# Patient Record
Sex: Female | Born: 1960 | Race: White | Hispanic: No | Smoking: Current every day smoker
Health system: Southern US, Community
[De-identification: ages and names within clinical notes are randomized; demographics above are authoritative.]

## PROBLEM LIST (undated history)

## (undated) DIAGNOSIS — G8929 Other chronic pain: Secondary | ICD-10-CM

## (undated) DIAGNOSIS — J449 Chronic obstructive pulmonary disease, unspecified: Secondary | ICD-10-CM

## (undated) DIAGNOSIS — M069 Rheumatoid arthritis, unspecified: Secondary | ICD-10-CM

## (undated) DIAGNOSIS — F1910 Other psychoactive substance abuse, uncomplicated: Secondary | ICD-10-CM

## (undated) DIAGNOSIS — B1920 Unspecified viral hepatitis C without hepatic coma: Secondary | ICD-10-CM

## (undated) DIAGNOSIS — K219 Gastro-esophageal reflux disease without esophagitis: Secondary | ICD-10-CM

## (undated) DIAGNOSIS — M1990 Unspecified osteoarthritis, unspecified site: Secondary | ICD-10-CM

---

## 2016-01-16 ENCOUNTER — Other Ambulatory Visit (HOSPITAL_COMMUNITY): Payer: Self-pay

## 2016-01-16 ENCOUNTER — Inpatient Hospital Stay
Admission: AD | Admit: 2016-01-16 | Discharge: 2016-02-24 | Disposition: A | Discharge status: Skilled Nursing Facility | Payer: Self-pay | Source: Ambulatory Visit | Attending: Internal Medicine | Admitting: Internal Medicine

## 2016-01-16 DIAGNOSIS — R1310 Dysphagia, unspecified: Secondary | ICD-10-CM

## 2016-01-16 DIAGNOSIS — K5900 Constipation, unspecified: Secondary | ICD-10-CM

## 2016-01-16 DIAGNOSIS — Z931 Gastrostomy status: Secondary | ICD-10-CM

## 2016-01-16 DIAGNOSIS — J80 Acute respiratory distress syndrome: Secondary | ICD-10-CM

## 2016-01-16 DIAGNOSIS — E46 Unspecified protein-calorie malnutrition: Secondary | ICD-10-CM | POA: Insufficient documentation

## 2016-01-16 DIAGNOSIS — J189 Pneumonia, unspecified organism: Secondary | ICD-10-CM

## 2016-01-16 DIAGNOSIS — J9601 Acute respiratory failure with hypoxia: Secondary | ICD-10-CM | POA: Diagnosis present

## 2016-01-16 DIAGNOSIS — R109 Unspecified abdominal pain: Secondary | ICD-10-CM

## 2016-01-16 DIAGNOSIS — J9690 Respiratory failure, unspecified, unspecified whether with hypoxia or hypercapnia: Secondary | ICD-10-CM

## 2016-01-16 DIAGNOSIS — Z4659 Encounter for fitting and adjustment of other gastrointestinal appliance and device: Secondary | ICD-10-CM

## 2016-01-16 DIAGNOSIS — Z930 Tracheostomy status: Secondary | ICD-10-CM

## 2016-01-16 DIAGNOSIS — Z9911 Dependence on respirator [ventilator] status: Secondary | ICD-10-CM

## 2016-01-16 HISTORY — DX: Unspecified osteoarthritis, unspecified site: M19.90

## 2016-01-16 HISTORY — DX: Gastro-esophageal reflux disease without esophagitis: K21.9

## 2016-01-16 HISTORY — DX: Chronic obstructive pulmonary disease, unspecified: J44.9

## 2016-01-16 HISTORY — DX: Unspecified viral hepatitis C without hepatic coma: B19.20

## 2016-01-16 HISTORY — DX: Rheumatoid arthritis, unspecified: M06.9

## 2016-01-16 HISTORY — DX: Other psychoactive substance abuse, uncomplicated: F19.10

## 2016-01-16 HISTORY — DX: Other chronic pain: G89.29

## 2016-01-17 LAB — CBC WITH DIFFERENTIAL/PLATELET
BASOS PCT: 0 %
Basophils Absolute: 0 10*3/uL (ref 0.0–0.1)
EOS ABS: 0.1 10*3/uL (ref 0.0–0.7)
EOS PCT: 1 %
HCT: 25.6 % — ABNORMAL LOW (ref 36.0–46.0)
Hemoglobin: 7.4 g/dL — ABNORMAL LOW (ref 12.0–15.0)
LYMPHS ABS: 2.7 10*3/uL (ref 0.7–4.0)
Lymphocytes Relative: 20 %
MCH: 27.1 pg (ref 26.0–34.0)
MCHC: 28.9 g/dL — ABNORMAL LOW (ref 30.0–36.0)
MCV: 93.8 fL (ref 78.0–100.0)
Monocytes Absolute: 0.6 10*3/uL (ref 0.1–1.0)
Monocytes Relative: 5 %
Neutro Abs: 10 10*3/uL — ABNORMAL HIGH (ref 1.7–7.7)
Neutrophils Relative %: 74 %
PLATELETS: 485 10*3/uL — AB (ref 150–400)
RBC: 2.73 MIL/uL — AB (ref 3.87–5.11)
RDW: 19.3 % — ABNORMAL HIGH (ref 11.5–15.5)
WBC: 13.5 10*3/uL — AB (ref 4.0–10.5)

## 2016-01-17 LAB — URINE MICROSCOPIC-ADD ON

## 2016-01-17 LAB — MAGNESIUM: Magnesium: 1.7 mg/dL (ref 1.7–2.4)

## 2016-01-17 LAB — COMPREHENSIVE METABOLIC PANEL
ALBUMIN: 1.8 g/dL — AB (ref 3.5–5.0)
ALT: 64 U/L — AB (ref 14–54)
ANION GAP: 13 (ref 5–15)
AST: 242 U/L — ABNORMAL HIGH (ref 15–41)
Alkaline Phosphatase: 43 U/L (ref 38–126)
BUN: 41 mg/dL — ABNORMAL HIGH (ref 6–20)
CHLORIDE: 106 mmol/L (ref 101–111)
CO2: 32 mmol/L (ref 22–32)
CREATININE: 0.92 mg/dL (ref 0.44–1.00)
Calcium: 8.5 mg/dL — ABNORMAL LOW (ref 8.9–10.3)
GFR calc non Af Amer: >60 mL/min (ref >60)
Glucose, Bld: 146 mg/dL — ABNORMAL HIGH (ref 65–99)
Potassium: 2.9 mmol/L — ABNORMAL LOW (ref 3.5–5.1)
SODIUM: 151 mmol/L — AB (ref 135–145)
Total Bilirubin: 0.4 mg/dL (ref 0.3–1.2)
Total Protein: 5.8 g/dL — ABNORMAL LOW (ref 6.5–8.1)

## 2016-01-17 LAB — URINALYSIS, ROUTINE W REFLEX MICROSCOPIC
BILIRUBIN URINE: NEGATIVE
Glucose, UA: NEGATIVE mg/dL
Ketones, ur: NEGATIVE mg/dL
Nitrite: NEGATIVE
PROTEIN: 100 mg/dL — AB
Specific Gravity, Urine: 1.016 (ref 1.005–1.030)
pH: 7 (ref 5.0–8.0)

## 2016-01-17 LAB — BLOOD GAS, ARTERIAL
Acid-Base Excess: 8.7 mmol/L — ABNORMAL HIGH (ref 0.0–2.0)
BICARBONATE: 32.1 meq/L — AB (ref 20.0–24.0)
Drawn by: 27022
FIO2: 0.3
O2 SAT: 89 %
PATIENT TEMPERATURE: 101.3
PCO2 ART: 43 mmHg (ref 35.0–45.0)
PEEP: 5 cmH2O
PH ART: 7.493 — AB (ref 7.350–7.450)
PO2 ART: 63.6 mmHg — AB (ref 80.0–100.0)
RATE: 22 resp/min
TCO2: 33.4 mmol/L (ref 0–100)
VT: 550 mL

## 2016-01-17 LAB — C DIFFICILE QUICK SCREEN W PCR REFLEX
C Diff antigen: NEGATIVE
C Diff interpretation: NEGATIVE
C Diff toxin: NEGATIVE

## 2016-01-17 LAB — PHOSPHORUS: PHOSPHORUS: 3 mg/dL (ref 2.5–4.6)

## 2016-01-17 LAB — PROCALCITONIN: PROCALCITONIN: 0.28 ng/mL

## 2016-01-17 LAB — T4, FREE: Free T4: 0.54 ng/dL — ABNORMAL LOW (ref 0.61–1.12)

## 2016-01-17 LAB — TSH: TSH: 1.881 u[IU]/mL (ref 0.350–4.500)

## 2016-01-18 LAB — BASIC METABOLIC PANEL
ANION GAP: 9 (ref 5–15)
BUN: 26 mg/dL — ABNORMAL HIGH (ref 6–20)
CALCIUM: 8.6 mg/dL — AB (ref 8.9–10.3)
CHLORIDE: 112 mmol/L — AB (ref 101–111)
CO2: 30 mmol/L (ref 22–32)
Creatinine, Ser: 0.73 mg/dL (ref 0.44–1.00)
GFR calc non Af Amer: >60 mL/min (ref >60)
GLUCOSE: 140 mg/dL — AB (ref 65–99)
Potassium: 3 mmol/L — ABNORMAL LOW (ref 3.5–5.1)
Sodium: 151 mmol/L — ABNORMAL HIGH (ref 135–145)

## 2016-01-18 LAB — CBC
HEMATOCRIT: 26.2 % — AB (ref 36.0–46.0)
HEMOGLOBIN: 7.5 g/dL — AB (ref 12.0–15.0)
MCH: 26.9 pg (ref 26.0–34.0)
MCHC: 28.6 g/dL — AB (ref 30.0–36.0)
MCV: 93.9 fL (ref 78.0–100.0)
Platelets: 493 10*3/uL — ABNORMAL HIGH (ref 150–400)
RBC: 2.79 MIL/uL — ABNORMAL LOW (ref 3.87–5.11)
RDW: 19.3 % — AB (ref 11.5–15.5)
WBC: 13.5 10*3/uL — AB (ref 4.0–10.5)

## 2016-01-18 LAB — URINE CULTURE

## 2016-01-18 LAB — PROCALCITONIN: Procalcitonin: 0.14 ng/mL

## 2016-01-19 ENCOUNTER — Encounter: Payer: Self-pay | Admitting: Adult Health

## 2016-01-19 DIAGNOSIS — J80 Acute respiratory distress syndrome: Secondary | ICD-10-CM | POA: Insufficient documentation

## 2016-01-19 DIAGNOSIS — J9601 Acute respiratory failure with hypoxia: Secondary | ICD-10-CM | POA: Diagnosis present

## 2016-01-19 LAB — CBC
HCT: 25.4 % — ABNORMAL LOW (ref 36.0–46.0)
Hemoglobin: 7.7 g/dL — ABNORMAL LOW (ref 12.0–15.0)
MCH: 28.5 pg (ref 26.0–34.0)
MCHC: 30.3 g/dL (ref 30.0–36.0)
MCV: 94.1 fL (ref 78.0–100.0)
Platelets: 448 10*3/uL — ABNORMAL HIGH (ref 150–400)
RBC: 2.7 MIL/uL — ABNORMAL LOW (ref 3.87–5.11)
RDW: 18.8 % — ABNORMAL HIGH (ref 11.5–15.5)
WBC: 14.4 10*3/uL — AB (ref 4.0–10.5)

## 2016-01-19 LAB — BASIC METABOLIC PANEL
ANION GAP: 11 (ref 5–15)
BUN: 25 mg/dL — ABNORMAL HIGH (ref 6–20)
CALCIUM: 8.5 mg/dL — AB (ref 8.9–10.3)
CHLORIDE: 105 mmol/L (ref 101–111)
CO2: 29 mmol/L (ref 22–32)
CREATININE: 0.65 mg/dL (ref 0.44–1.00)
GFR calc non Af Amer: >60 mL/min (ref >60)
Glucose, Bld: 104 mg/dL — ABNORMAL HIGH (ref 65–99)
Potassium: 2.4 mmol/L — CL (ref 3.5–5.1)
SODIUM: 145 mmol/L (ref 135–145)

## 2016-01-19 LAB — PHOSPHORUS: PHOSPHORUS: 3.1 mg/dL (ref 2.5–4.6)

## 2016-01-19 LAB — MAGNESIUM: MAGNESIUM: 1.4 mg/dL — AB (ref 1.7–2.4)

## 2016-01-19 NOTE — Consult Note (Signed)
Name: Robyn Gross MRN: [id number] DOB: Sep 15, 1961    ADMISSION DATE:  01/16/2016 CONSULTATION DATE:  2/20  REFERRING MD :  Hijazi  CHIEF COMPLAINT:  Vent management, acute respiratory failure   BRIEF PATIENT DESCRIPTION: 55yo female with hx COPD, RA on methotrexate, chronic pain, polysubstance abuse, Hep C initially admitted 2/5 to outside hospital with acute respiratory failure r/t CAP, AECOPD.  She required intubation 2/6 and mechanical ventilatory support.   Course was c/b AKI, sepsis, ARDS.  She was treated with IV abx and ultimately tx to [facility] 2/17 for further vent wean and ongoing rehabilitation.  PCCM consulted 2/20 for vent management and assist with weaning efforts.  She remains on full vent support.    SIGNIFICANT EVENTS    STUDIES:     HISTORY OF PRESENT ILLNESS:  55yo female with hx COPD, RA on methotrexate, chronic pain, polysubstance abuse, Hep C initially admitted 2/5 to outside hospital with acute respiratory failure r/t CAP, AECOPD, ARDS.  She required intubation and mechanical ventilatory support (unclear date of intubation).  She was treated with IV abx and ultimately tx to [facility] 2/17 for further vent wean and ongoing rehabilitation.  PCCM consulted 2/20 for vent management and assist with weaning efforts.  She remains on full vent support.   PAST MEDICAL HISTORY :   has a past medical history of COPD (chronic obstructive pulmonary disease) (HCC); Rheumatoid arthritis (HCC); Hepatitis C; Polysubstance abuse; Chronic pain; GERD (gastroesophageal reflux disease); and Osteoarthritis.  has no past surgical history on file. Prior to Admission medications   Not on File   Allergies  Allergen Reactions  . Sulfa Antibiotics     FAMILY HISTORY:  Family hx unavailable, pt somnolent on vent, none found in review of records.   SOCIAL HISTORY:  reports that she has been smoking.  She does not have any smokeless tobacco history on file.  REVIEW OF SYSTEMS:     As per HPI - All other systems reviewed and were neg.    SUBJECTIVE:   VITAL SIGNS:   HR 93 O2 sats 100% on 0.35 116/68 RR 22  PHYSICAL EXAMINATION: General:  Chronically ill appearing female, NAD on vent  Neuro:  Somnolent, not on sedation, opens eyes to voice, does not follow commands HEENT:  Mm moist, ETT Cardiovascular:  s1s2 rrr Lungs:  resps even, non labored on full support, few scattered rhonchi, mild intermittent exp wheeze  Abdomen:  Round, soft, +bs  Musculoskeletal:  Warm and dry, no sig edema, foot drop    Recent Labs Lab 01/17/16 0625 01/18/16 0420 01/19/16 0630  NA 151* 151* 145  K 2.9* 3.0* 2.4*  CL 106 112* 105  CO2 32 30 29  BUN 41* 26* 25*  CREATININE 0.92 0.73 0.65  GLUCOSE 146* 140* 104*    Recent Labs Lab 01/17/16 0625 01/18/16 0420 01/19/16 0630  HGB 7.4* 7.5* 7.7*  HCT 25.6* 26.2* 25.4*  WBC 13.5* 13.5* 14.4*  PLT 485* 493* 448*   No results found.  ASSESSMENT / PLAN:  Acute respiratory failure -- multifactorial r/t severe CAP with ARDS +/- AECOPD.  Continues to fail SBT.  Failed over weekend r/t apnea.  This am (2/20) failed r/t tachypnea with RR 40's.  Initially intubated 2/6.   AECOPD  Severe CAP - abx complete  Anemia  AKI - resolved   PLAN -  -F/u CXR  -Wean steroids as able -Continue BD's  -Cont vent support with daily SBT  -Limit sedating meds  as able -- not on continuous sedation but does have PRN versed - use sparingly and consider d/c qhs trazadone -If no significant progress with weaning over next 24 hours will likely need trach for prolonged wean.  Currently D14 ETT -Anemia w/u per primary    Dirk Dress, NP 01/19/2016  11:38 AM Pager: [phone] or [phone]   Attending Note:  I have examined patient, reviewed labs, studies and notes. I have discussed the case with Jasper Riling, and I agree with the data and plans as amended above. She has been ventilated for ~ 2 weeks, do not see evidence that  she is going to wean today. I would recommend trach by ENT, continued attempts at slow vent weaning. Independent critical care time is 31 minutes.   Levy Pupa, MD, PhD 01/19/2016, 12:15 PM [facility] [phone] or if no answer [phone]

## 2016-01-20 ENCOUNTER — Other Ambulatory Visit (HOSPITAL_COMMUNITY): Payer: Self-pay

## 2016-01-20 LAB — CBC
HEMATOCRIT: 27.3 % — AB (ref 36.0–46.0)
Hemoglobin: 8.3 g/dL — ABNORMAL LOW (ref 12.0–15.0)
MCH: 28.2 pg (ref 26.0–34.0)
MCHC: 30.4 g/dL (ref 30.0–36.0)
MCV: 92.9 fL (ref 78.0–100.0)
Platelets: 508 10*3/uL — ABNORMAL HIGH (ref 150–400)
RBC: 2.94 MIL/uL — ABNORMAL LOW (ref 3.87–5.11)
RDW: 18.6 % — AB (ref 11.5–15.5)
WBC: 17.3 10*3/uL — AB (ref 4.0–10.5)

## 2016-01-20 LAB — BASIC METABOLIC PANEL
ANION GAP: 12 (ref 5–15)
BUN: 25 mg/dL — ABNORMAL HIGH (ref 6–20)
CALCIUM: 8.4 mg/dL — AB (ref 8.9–10.3)
CO2: 27 mmol/L (ref 22–32)
Chloride: 106 mmol/L (ref 101–111)
Creatinine, Ser: 0.67 mg/dL (ref 0.44–1.00)
Glucose, Bld: 123 mg/dL — ABNORMAL HIGH (ref 65–99)
Potassium: 2.8 mmol/L — ABNORMAL LOW (ref 3.5–5.1)
SODIUM: 145 mmol/L (ref 135–145)

## 2016-01-20 LAB — MAGNESIUM: MAGNESIUM: 1.4 mg/dL — AB (ref 1.7–2.4)

## 2016-01-20 LAB — PROCALCITONIN

## 2016-01-21 ENCOUNTER — Other Ambulatory Visit (HOSPITAL_COMMUNITY): Payer: Self-pay

## 2016-01-21 LAB — BASIC METABOLIC PANEL
ANION GAP: 12 (ref 5–15)
BUN: 25 mg/dL — ABNORMAL HIGH (ref 6–20)
CALCIUM: 8.5 mg/dL — AB (ref 8.9–10.3)
CO2: 27 mmol/L (ref 22–32)
CREATININE: 0.56 mg/dL (ref 0.44–1.00)
Chloride: 107 mmol/L (ref 101–111)
GFR calc non Af Amer: >60 mL/min (ref >60)
Glucose, Bld: 119 mg/dL — ABNORMAL HIGH (ref 65–99)
Potassium: 2.6 mmol/L — CL (ref 3.5–5.1)
SODIUM: 146 mmol/L — AB (ref 135–145)

## 2016-01-21 LAB — CBC
HEMATOCRIT: 25.7 % — AB (ref 36.0–46.0)
HEMOGLOBIN: 7.7 g/dL — AB (ref 12.0–15.0)
MCH: 27.3 pg (ref 26.0–34.0)
MCHC: 30 g/dL (ref 30.0–36.0)
MCV: 91.1 fL (ref 78.0–100.0)
Platelets: 468 10*3/uL — ABNORMAL HIGH (ref 150–400)
RBC: 2.82 MIL/uL — ABNORMAL LOW (ref 3.87–5.11)
RDW: 19.2 % — AB (ref 11.5–15.5)
WBC: 15.3 10*3/uL — AB (ref 4.0–10.5)

## 2016-01-21 LAB — APTT: APTT: 32 s (ref 24–37)

## 2016-01-21 LAB — PROTIME-INR
INR: 1.13 (ref 0.00–1.49)
PROTHROMBIN TIME: 14.7 s (ref 11.6–15.2)

## 2016-01-21 LAB — MAGNESIUM: MAGNESIUM: 1.7 mg/dL (ref 1.7–2.4)

## 2016-01-21 LAB — POTASSIUM: POTASSIUM: 3.6 mmol/L (ref 3.5–5.1)

## 2016-01-21 LAB — PHOSPHORUS: PHOSPHORUS: 3.2 mg/dL (ref 2.5–4.6)

## 2016-01-21 MED ORDER — IOHEXOL 300 MG/ML  SOLN
50.0000 mL | Freq: Once | INTRAMUSCULAR | Status: AC | PRN
  Administered 2016-01-22: 30 mL

## 2016-01-22 ENCOUNTER — Other Ambulatory Visit (HOSPITAL_COMMUNITY): Payer: Self-pay

## 2016-01-22 DIAGNOSIS — Z4659 Encounter for fitting and adjustment of other gastrointestinal appliance and device: Secondary | ICD-10-CM | POA: Insufficient documentation

## 2016-01-22 DIAGNOSIS — R1310 Dysphagia, unspecified: Secondary | ICD-10-CM

## 2016-01-22 LAB — POTASSIUM: POTASSIUM: 3.2 mmol/L — AB (ref 3.5–5.1)

## 2016-01-22 NOTE — Consult Note (Signed)
Name: Robyn Gross MRN: [id number] DOB: 07/29/1961    ADMISSION DATE:  01/16/2016 CONSULTATION DATE:  2/20  REFERRING MD :  Hijazi  CHIEF COMPLAINT:  Vent management, acute respiratory failure   BRIEF PATIENT DESCRIPTION: 55yo female with hx COPD, RA on methotrexate, chronic pain, polysubstance abuse, Hep C initially admitted 2/5 to outside hospital with acute respiratory failure r/t CAP, AECOPD.  She required intubation 2/6 and mechanical ventilatory support.   Course was c/b AKI, sepsis, ARDS.  She was treated with IV abx and ultimately tx to [facility] 2/17 for further vent wean and ongoing rehabilitation.  PCCM consulted 2/20 for vent management and assist with weaning efforts.  She remains on full vent support.    SIGNIFICANT EVENTS   STUDIES:  CT 2/23>>>  SUBJECTIVE: just back from CT abdomen, NGT placed  VITAL SIGNS:   100.7, 123 26, 157/96  97% Vent 22 550 28% peep 5  PHYSICAL EXAMINATION: General:  Chronically ill appearing female, NAD on vent  Neuro: awakens, not follows commands, rass -1 HEENT: ETT Cardiovascular:  s1s2 rrr Lungs:  reduced resolved wheezing Abdomen:  Round, soft, +bs , mild distention Musculoskeletal:  Warm and dry, no sig edema, foot drop    Recent Labs Lab 01/19/16 0630 01/20/16 0608 01/21/16 0647 01/21/16 1910 01/22/16 0625  NA 145 145 146*  --   --   K 2.4* 2.8* 2.6* 3.6 3.2*  CL 105 106 107  --   --   CO2 29 27 27   --   --   BUN 25* 25* 25*  --   --   CREATININE 0.65 0.67 0.56  --   --   GLUCOSE 104* 123* 119*  --   --     Recent Labs Lab 01/19/16 0630 01/20/16 0608 01/21/16 0647  HGB 7.7* 8.3* 7.7*  HCT 25.4* 27.3* 25.7*  WBC 14.4* 17.3* 15.3*  PLT 448* 508* 468*   Dg Abd 1 View  01/22/2016  CLINICAL DATA:  Feeding tube placement EXAM: ABDOMEN - 1 VIEW COMPARISON:  Abdomen film of 01/16/2016. Fluoroscopy time of 2 minutes 36 seconds FINDINGS: A single C-arm spot film documents placement of the feeding tube with the tip  near the ligament of Treitz. A small amount of contrast water-soluble was injected to document position. IMPRESSION: Feeding tube placed with the tip near the ligament of Treitz. Electronically Signed   By: Ivar Drape M.D.   On: 01/22/2016 11:35   Dg Loyce Dys Tube Plc W/fl-no Rad  01/22/2016  CLINICAL DATA:  NASO G TUBE PLACEMENT WITH FLUORO Fluoroscopy was utilized by the requesting physician.  No radiographic interpretation.    ASSESSMENT / PLAN:  Acute respiratory failure -- multifactorial r/t severe CAP with ARDS +/- AECOPD.  Continues to fail SBT.  Failed over weekend r/t apnea.  This am (2/20) failed r/t tachypnea with RR 40's.  Initially intubated 2/6.   AECOPD  Severe CAP - abx complete  Anemia  AKI - resolved   PLAN -  -weaning efforts PS 12/5 , fails, this is over 2 weeks ETT = trach required pcxr not too impressive, some int changes rt, CT bases today with int changes -replace K  -WUA mandatory, fent prn -abg reviewed, reduce rate, avoid alk -coags reviewed -pred reduce daily -given the long period of time with ett , failed weaning, she would benefit from trach now and not Monday, concern vocal cord injury etc if wait to monday  -in best interest of pt,  will plan bedside trach asap -consider further weaning attempts to PS 20 -consider tolerate rr 40 if maintaining volumes as may be neuro related -will discuss with [facility] to have bedside evaluation of pt after trach for safety Ccm time 30 min   Lavon Paganini. Titus Mould, MD, [place] Pgr: [place] [facility]

## 2016-01-23 ENCOUNTER — Other Ambulatory Visit (HOSPITAL_COMMUNITY): Payer: Self-pay

## 2016-01-23 ENCOUNTER — Encounter (HOSPITAL_COMMUNITY): Payer: Self-pay

## 2016-01-23 DIAGNOSIS — J9690 Respiratory failure, unspecified, unspecified whether with hypoxia or hypercapnia: Secondary | ICD-10-CM | POA: Insufficient documentation

## 2016-01-23 DIAGNOSIS — Z9911 Dependence on respirator [ventilator] status: Secondary | ICD-10-CM | POA: Insufficient documentation

## 2016-01-23 LAB — BASIC METABOLIC PANEL
ANION GAP: 10 (ref 5–15)
BUN: 16 mg/dL (ref 6–20)
CALCIUM: 8.4 mg/dL — AB (ref 8.9–10.3)
CO2: 23 mmol/L (ref 22–32)
Chloride: 108 mmol/L (ref 101–111)
Creatinine, Ser: 0.65 mg/dL (ref 0.44–1.00)
Glucose, Bld: 115 mg/dL — ABNORMAL HIGH (ref 65–99)
POTASSIUM: 3.1 mmol/L — AB (ref 3.5–5.1)
SODIUM: 141 mmol/L (ref 135–145)

## 2016-01-23 LAB — URINALYSIS, ROUTINE W REFLEX MICROSCOPIC
Bilirubin Urine: NEGATIVE
GLUCOSE, UA: NEGATIVE mg/dL
KETONES UR: NEGATIVE mg/dL
Nitrite: NEGATIVE
PROTEIN: 30 mg/dL — AB
Specific Gravity, Urine: 1.019 (ref 1.005–1.030)
pH: 5.5 (ref 5.0–8.0)

## 2016-01-23 LAB — CBC
HCT: 26.7 % — ABNORMAL LOW (ref 36.0–46.0)
Hemoglobin: 8.2 g/dL — ABNORMAL LOW (ref 12.0–15.0)
MCH: 28.9 pg (ref 26.0–34.0)
MCHC: 30.7 g/dL (ref 30.0–36.0)
MCV: 94 fL (ref 78.0–100.0)
PLATELETS: 367 10*3/uL (ref 150–400)
RBC: 2.84 MIL/uL — AB (ref 3.87–5.11)
RDW: 19.9 % — AB (ref 11.5–15.5)
WBC: 16.4 10*3/uL — AB (ref 4.0–10.5)

## 2016-01-23 LAB — URINE MICROSCOPIC-ADD ON

## 2016-01-23 NOTE — Procedures (Signed)
Name:  Robyn Gross MRN:  [id number] DOB:  03-06-1961  OPERATIVE NOTE  Procedure:  Percutaneous tracheostomy.  Indications:  Ventilator-dependent respiratory failure.  Consent:  Procedure, alternatives, risks and benefits discussed with medical POA.  Questions answered.  Consent obtained.  Anesthesia:  Versed, fent, vec, etomidate x 2  Procedure summary:  Appropriate equipment was assembled.  The patient was identified as Robyn Gross and safety timeout was performed. The patient was placed in supine position with a towel roll behind shoulder blades and neck extended.  Sterile technique was used. The patient's neck and upper chest were prepped using chlorhexidine / alcohol scrub and the field was draped in usual sterile fashion with full body drape. After the adequate sedation / anesthesia was achieved, attention was directed at the midline trachea, where the cricothyroid membrane was palpated. Approximately two fingerbreadths above the sternal notch, a verticle incision was created with a scalpel after local infiltration with 0.2% Lidocaine. Then, using Seldinger technique and a percutaneous tracheostomy set, the trachea was entered with a 14 gauge needle with an overlying sheath. This was all confirmed under direct visualization of a fiberoptic flexible bronchoscope. Entrance into the trachea was identified through the third tracheal ring interspace. Following this, a guidewire was inserted. The needle was removed, leaving the sheath and the guidewire intact. Next, the sheath was removed and a small dilator was inserted. The tracheal rings were then dilated. A #6 Shiley was then opened. The balloon was checked. It was placed over a tracheal dilator, which was then advanced over the guidewire and through the previously dilated tract. The Shiley tracheostomy tube was noted to pass in the trachea with little resistance. The guidewire and dilator tubes were removed from the trachea. An inner cannula  was placed through the tracheostomy tube. The tracheostomy was then secured at the anterior neck with 4 monofilament sutures. The oral endotracheal tube was removed and the ventilator was attached to the newly placed tracheostomy tube. Adequate tidal volumes were noted. The cuff was inflated and no evidence of air leak was noted. No evidence of bleeding was noted. At this point, the procedure was concluded. Post-procedure chest x-ray was ordered.  Complications:  No immediate complications were noted.  Hemodynamic parameters and oxygenation remained stable throughout the procedure.  Estimated blood loss:  Less then 1 mL.  Nelda Bucks., MD Pulmonary and Critical Care Medicine [facility] Pager: [phone]  01/23/2016, 9:10 AM   Can follow up in [facility]  [phone]

## 2016-01-23 NOTE — Procedures (Signed)
Procedure done by Kreg Shropshire ACNP-BC  At first bronch was introduce through ET tube and structures of tracheal rings, carina identified for operator of tracheostomy who was Dr Tyson Alias. Light of bronch passed through trachea and skin for indentification of tracheal rings for tracheostomy puncture. After this, under bronchoscopy guidance,  ET tube was pulled back sufficiently and very carefully. The ET tube was  pulled back enough to give room for tracheostomy operator and yet at same time to to ensure a secured airway. After this was accomplished, bronchoscope was withdrawn into the ET tube. After this,  Dr Tyson Alias  then performed tracheostomy under video visual provided by flexible video bronchoscopy. Followng introduction of tracheostomy,  the bronchoscope was removed from ET tube and introduced through tracheostomy. Correct position of tracheostomy was ensured, with enough room between carina and distal tracheostomy and no evidence of bleeding. The bronchoscope was then withdrawn. Respiratory therapist was then instructed to remove the ET tube.  Dr Tyson Alias  then proceeded to complete the tracheostomy with stay sutures   No complications   Simonne Martinet ACNP-BC [facility] Pager # [phone] OR # [phone] if no answer   Supervised procedure, tolerated well No posterior wall  Injury, backed out ETT  Without losing airway  Mcarthur Rossetti. Tyson Alias, MD, FACP Pgr: [phone] [place] [facility]

## 2016-01-24 LAB — CBC
HCT: 26.8 % — ABNORMAL LOW (ref 36.0–46.0)
HEMOGLOBIN: 7.9 g/dL — AB (ref 12.0–15.0)
MCH: 27.5 pg (ref 26.0–34.0)
MCHC: 29.5 g/dL — AB (ref 30.0–36.0)
MCV: 93.4 fL (ref 78.0–100.0)
Platelets: 393 10*3/uL (ref 150–400)
RBC: 2.87 MIL/uL — ABNORMAL LOW (ref 3.87–5.11)
RDW: 19.5 % — ABNORMAL HIGH (ref 11.5–15.5)
WBC: 14.3 10*3/uL — ABNORMAL HIGH (ref 4.0–10.5)

## 2016-01-24 LAB — MAGNESIUM: MAGNESIUM: 1.6 mg/dL — AB (ref 1.7–2.4)

## 2016-01-25 DIAGNOSIS — E46 Unspecified protein-calorie malnutrition: Secondary | ICD-10-CM | POA: Insufficient documentation

## 2016-01-25 LAB — URINE CULTURE: Culture: >=100000

## 2016-01-25 NOTE — Consult Note (Signed)
Chief Complaint: Patient was seen in consultation today for percutaneous gastrostomy tube placement  Referring Physician(s): Hijazi  Shick, Trevor  History of Present Illness: Robyn Gross is a 55 y.o. female with past medical history significant for COPD, rheumatoid arthritis, hepatitis C, polysubstance abuse, chronic pain syndrome, GERD, osteoarthritis who was recently admitted with respiratory failure/COPD exacerbation/pneumonia well as acute kidney injury. Patient has since undergone tracheostomy on 01/23/16. Due to dysphagia and malnutrition as well as current respiratory status percutaneous gastrostomy tube has been requested.  Past Medical History  Diagnosis Date  . COPD (chronic obstructive pulmonary disease) (HCC)   . Rheumatoid arthritis (HCC)   . Hepatitis C   . Polysubstance abuse   . Chronic pain   . GERD (gastroesophageal reflux disease)   . Osteoarthritis     No past surgical history on file.  Allergies: Sulfa antibiotics  Medications: Prior to Admission medications   Not on File     No family history on file.  Social History   Social History  . Marital Status: Unknown    Spouse Name: N/A  . Number of Children: N/A  . Years of Education: N/A   Social History Main Topics  . Smoking status: Current Every Day Smoker  . Smokeless tobacco: Not on file  . Alcohol Use: Not on file  . Drug Use: Not on file  . Sexual Activity: Not on file   Other Topics Concern  . Not on file   Social History Narrative  . No narrative on file      Review of Systems as above  Vital Signs: Blood pressure 151/87, heart rate 115, afebrile, O2 sats 97% LMP  (LMP Unknown)  Physical Exam patient awake but nonverbal. Does track. NG tube and trach in place. Chest with distant breath sounds bilaterally. Heart with tachycardia but regular rhythm. Abdomen mildly distended, positive bowel sounds, nontender. Extremities with no significant edema, foot drop  Mallampati  Score:     Imaging: Ct Abdomen Wo Contrast  01/22/2016  CLINICAL DATA:  Evaluation of anatomy for G-tube placement. EXAM: CT ABDOMEN WITHOUT CONTRAST TECHNIQUE: Multidetector CT imaging of the abdomen was performed following the standard protocol without IV contrast. COMPARISON:  Abdominal radiograph 01/16/2016 FINDINGS: Lower chest: Normal heart size. Subpleural reticular and ground-glass opacities demonstrated within the left lower lobe and right middle lobe. Tree-in-bud ground-glass opacities within the left lower lobe. No pleural effusion. Normal heart size. Hepatobiliary: Liver is normal in size and contour. Status post cholecystectomy. Pancreas: Unremarkable Spleen: Unremarkable Adrenals/Urinary Tract: The adrenal glands are normal. Kidneys are symmetric in size. Small amount of perinephric fat stranding and fluid. Stomach/Bowel: Enteric tube tip projects in the fourth portion of the duodenum. Normal morphology to the stomach. Oral contrast material is present within the colon and proximal small bowel. No evidence for bowel obstruction. Vascular/Lymphatic: Normal caliber abdominal aorta. No retroperitoneal adenopathy. Other: None. Musculoskeletal: Lumbar spine degenerative changes. No aggressive or acute appearing osseous lesions. Multiple chronic appearing left rib fractures are demonstrated. IMPRESSION: No acute process within the abdomen. Subpleural ground-glass and reticular opacities predominately within the right middle and left lower lobes with associated tree-in-bud opacities in the left lower lobe. Findings are non specific and may be infectious or inflammatory in etiology. Consider dedicated evaluation with chest CT for more definitive characterization. Electronically Signed   By: Annia Belt M.D.   On: 01/22/2016 13:40   Dg Abd 1 View  01/22/2016  CLINICAL DATA:  Feeding tube placement EXAM:  ABDOMEN - 1 VIEW COMPARISON:  Abdomen film of 01/16/2016. Fluoroscopy time of 2 minutes 36 seconds  FINDINGS: A single C-arm spot film documents placement of the feeding tube with the tip near the ligament of Treitz. A small amount of contrast water-soluble was injected to document position. IMPRESSION: Feeding tube placed with the tip near the ligament of Treitz. Electronically Signed   By: Dwyane Dee M.D.   On: 01/22/2016 11:35   Dg Abd 1 View  01/16/2016  CLINICAL DATA:  55 year old female with NG tube placement EXAM: ABDOMEN - 1 VIEW COMPARISON:  Chest radiograph dated 01/16/2016 FINDINGS: An enteric tube is noted with tip projecting over the L3-L4 disc space and likely within the distal stomach. There is no evidence of bowel obstruction. No free air. No radiopaque calculi. Right upper quadrant cholecystectomy clips noted. No acute osseous pathology identified. IMPRESSION: Enteric tube the tip over the L3-L4 disc space likely in the distal stomach. Electronically Signed   By: Elgie Collard M.D.   On: 01/16/2016 19:00   Dg Chest Port 1 View  01/23/2016  CLINICAL DATA:  Tracheostomy creation EXAM: PORTABLE CHEST 1 VIEW COMPARISON:  January 23, 2016 study obtained earlier in the day and January 16, 2016 FINDINGS: There is no a tracheostomy present with the tracheostomy catheter tip 3.8 cm above the carina. Feeding tube tip is below the diaphragm. Central catheter tip is in the superior vena cava near the cavoatrial junction. No pneumothorax. There is interstitial prominence in both upper lobes, likely primarily due to scarring. Lungs elsewhere clear. Heart size and pulmonary vascularity are normal. No adenopathy. There is extensive arthropathy in the left shoulder. IMPRESSION: Tube and catheter positions as described without pneumothorax. Probable scarring in the upper lobes bilaterally. A degree of interstitial infiltrate in the upper lobes cannot be excluded radiographically. No new opacity. No change in cardiac silhouette. Electronically Signed   By: Bretta Bang III M.D.   On: 01/23/2016  10:01   Dg Chest Port 1 View  01/23/2016  CLINICAL DATA:  Respiratory failure. EXAM: PORTABLE CHEST 1 VIEW COMPARISON:  01/20/2016 . FINDINGS: Interim placement of feeding tube, its tip is below left hemidiaphragm. Endotracheal tube and left PICC line stable position. Mediastinum hilar structures normal. Heart size normal. Persistent left upper lobe and diffuse right lung interstitial prominence noted without change from prior exam. Improved from prior exam of 01/16/2016 . Component these changes may be chronic. No pleural effusion or pneumothorax. Heart size normal. IMPRESSION: 1. Interim placement of feeding tube. Its tip is below left hemidiaphragm. Endotracheal tube and left PICC line stable position. 2. Persistent left upper lobe and diffuse right lung interstitial prominence without change from prior exam. These changes are improved from prior exam of 01/16/2016. A component these changes may be chronic. Electronically Signed   By: Maisie Fus  Register   On: 01/23/2016 07:30   Dg Chest Port 1 View  01/20/2016  CLINICAL DATA:  ARDS EXAM: PORTABLE CHEST 1 VIEW COMPARISON:  01/16/2016 FINDINGS: Cardiac shadow is stable. A left-sided PICC line, endotracheal tube and nasogastric catheter are again identified and stable. Persistent changes in the left upper lobe and right upper lobe are again seen with some improved aeration in the right lung. No new focal infiltrate is seen. IMPRESSION: Persistent bilateral infiltrates although improved aeration on the right is noted. Electronically Signed   By: Alcide Clever M.D.   On: 01/20/2016 07:41   Dg Chest Port 1 View  01/16/2016  CLINICAL DATA:  Respiratory  failure. EXAM: PORTABLE CHEST 1 VIEW COMPARISON:  None. FINDINGS: Borderline enlarged cardiac silhouette. Nasogastric tube extending into the stomach. Endotracheal tube in satisfactory position. Left PICC tip at the junction of the superior vena cava and right atrium. Diffusely prominent interstitial markings  throughout the right lung and left upper lung zone. Less prominent interstitial markings in the left mid and lower lung zones. No pleural fluid. Mild thoracic spine degenerative changes. Moderate left shoulder degenerative changes. IMPRESSION: Probable combination of chronic interstitial lung disease and interstitial pneumonitis. The Electronically Signed   By: Beckie Salts M.D.   On: 01/16/2016 18:59   Dg Vangie Bicker G Tube Plc W/fl-no Rad  01/22/2016  CLINICAL DATA:  NASO G TUBE PLACEMENT WITH FLUORO Fluoroscopy was utilized by the requesting physician.  No radiographic interpretation.    Labs:  CBC:  Recent Labs  01/20/16 0608 01/21/16 0647 01/23/16 0634 01/24/16 0630  WBC 17.3* 15.3* 16.4* 14.3*  HGB 8.3* 7.7* 8.2* 7.9*  HCT 27.3* 25.7* 26.7* 26.8*  PLT 508* 468* 367 393    COAGS:  Recent Labs  01/21/16 1910  INR 1.13  APTT 32    BMP:  Recent Labs  01/19/16 0630 01/20/16 0608 01/21/16 0647 01/21/16 1910 01/22/16 0625 01/23/16 0634  NA 145 145 146*  --   --  141  K 2.4* 2.8* 2.6* 3.6 3.2* 3.1*  CL 105 106 107  --   --  108  CO2 --   --  23  GLUCOSE 104* 123* 119*  --   --  115*  BUN 25* 25* 25*  --   --  16  CALCIUM 8.5* 8.4* 8.5*  --   --  8.4*  CREATININE 0.65 0.67 0.56  --   --  0.65  GFRNONAA >60 >60 >60  --   --  >60  GFRAA >60 >60 >60  --   --  >60    LIVER FUNCTION TESTS:  Recent Labs  01/17/16 0625  BILITOT 0.4  AST 242*  ALT 64*  ALKPHOS 43  PROT 5.8*  ALBUMIN 1.8*    TUMOR MARKERS: No results for input(s): AFPTM, CEA, CA199, CHROMGRNA in the last 8760 hours.  Assessment and Plan: Patient with significant past medical history of COPD, osteoarthritis, chronic pain syndrome, hepatitis C, GERD recently admitted with respiratory failure/COPD exacerbation/pneumonia and acute kidney injury. Status post tracheostomy on 2/24. Now with dysphagia, malnutrition. Percutaneous gastrostomy tube requested. Imaging studies have been reviewed.  Details/risks of gastrostomy tube placement, including but not limited to, internal bleeding, infection, injury to adjacent organs, discussed with patient's mother with her understanding and consent. Procedure tentatively planned for 2/27. Check a.m. labs. Urine/blood cultures negative to date.   Thank you for this interesting consult.  I greatly enjoyed meeting Robyn Gross and look forward to participating in their care.  A copy of this report was sent to the requesting provider on this date.  Electronically Signed: D. Jeananne Rama 01/25/2016, 9:37 AM   I spent a total of 20 minutes in face to face in clinical consultation, greater than 50% of which was counseling/coordinating care for percutaneous gastrostomy tube placement

## 2016-01-26 DIAGNOSIS — Z9911 Dependence on respirator [ventilator] status: Secondary | ICD-10-CM

## 2016-01-26 DIAGNOSIS — Z930 Tracheostomy status: Secondary | ICD-10-CM

## 2016-01-26 LAB — CBC
HEMATOCRIT: 28 % — AB (ref 36.0–46.0)
Hemoglobin: 8.2 g/dL — ABNORMAL LOW (ref 12.0–15.0)
MCH: 27.4 pg (ref 26.0–34.0)
MCHC: 29.3 g/dL — AB (ref 30.0–36.0)
MCV: 93.6 fL (ref 78.0–100.0)
Platelets: 487 10*3/uL — ABNORMAL HIGH (ref 150–400)
RBC: 2.99 MIL/uL — ABNORMAL LOW (ref 3.87–5.11)
RDW: 19 % — AB (ref 11.5–15.5)
WBC: 14.7 10*3/uL — ABNORMAL HIGH (ref 4.0–10.5)

## 2016-01-26 LAB — BASIC METABOLIC PANEL
ANION GAP: 14 (ref 5–15)
BUN: 17 mg/dL (ref 6–20)
CO2: 27 mmol/L (ref 22–32)
Calcium: 8.5 mg/dL — ABNORMAL LOW (ref 8.9–10.3)
Chloride: 105 mmol/L (ref 101–111)
Creatinine, Ser: 0.57 mg/dL (ref 0.44–1.00)
GLUCOSE: 118 mg/dL — AB (ref 65–99)
POTASSIUM: 2.8 mmol/L — AB (ref 3.5–5.1)
Sodium: 146 mmol/L — ABNORMAL HIGH (ref 135–145)

## 2016-01-26 LAB — PROTIME-INR
INR: 1.18 (ref 0.00–1.49)
Prothrombin Time: 15.1 seconds (ref 11.6–15.2)

## 2016-01-26 LAB — APTT: aPTT: 32 seconds (ref 24–37)

## 2016-01-26 NOTE — Consult Note (Signed)
   Name: Robyn Gross MRN: [id number] DOB: June 27, 1961    ADMISSION DATE:  01/16/2016 CONSULTATION DATE:  2/20  REFERRING MD :  Hijazi  CHIEF COMPLAINT:  Vent management, acute respiratory failure   BRIEF PATIENT DESCRIPTION: 55yo female with hx COPD, RA on methotrexate, chronic pain, polysubstance abuse, Hep C initially admitted 2/5 to outside hospital with acute respiratory failure r/t CAP, AECOPD.  She required intubation 2/6 and mechanical ventilatory support.   Course was c/b AKI, sepsis, ARDS.  She was treated with IV abx and ultimately tx to [facility] 2/17 for further vent wean and ongoing rehabilitation.  PCCM consulted 2/20 for vent management and assist with weaning efforts.  She remains on full vent support.    SIGNIFICANT EVENTS   STUDIES:  CT 2/23>>>  SUBJECTIVE: No events overnight, high RR with PS weaning.  VITAL SIGNS:   100.7, 123 26, 157/96  97% Vent 22 550 28% peep 5  PHYSICAL EXAMINATION: General:  Chronically ill appearing female, NAD on vent  Neuro: Awakens, not follows commands, rass -1 HEENT: [place]/AT, PERRL, EOM-I and MMM.  Trach site clean. Cardiovascular:  RRR, Nl S1/S2, -M/R/G. Lungs:  Coarse BS diffusely. Abdomen:  Round, soft, NT, ND and +bs. Musculoskeletal:  Warm and dry, no sig edema, foot drop    Recent Labs Lab 01/21/16 0647  01/22/16 0625 01/23/16 0634 01/26/16 0530  NA 146*  --   --  141 146*  K 2.6*  < > 3.2* 3.1* 2.8*  CL 107  --   --  108 105  CO2 27  --   --  23 27  BUN 25*  --   --  16 17  CREATININE 0.56  --   --  0.65 0.57  GLUCOSE 119*  --   --  115* 118*  < > = values in this interval not displayed.  Recent Labs Lab 01/23/16 0634 01/24/16 0630 01/26/16 0530  HGB 8.2* 7.9* 8.2*  HCT 26.7* 26.8* 28.0*  WBC 16.4* 14.3* 14.7*  PLT 367 393 487*   I reviewed CXR myself, trach in good position.  ASSESSMENT / PLAN:  Acute respiratory failure -- multifactorial r/t severe CAP with ARDS +/- AECOPD.  Continues to fail SBT.   Failed over weekend r/t apnea.  This am (2/20) failed r/t tachypnea with RR 40's.  Initially intubated 2/6.   AECOPD  Severe CAP - abx complete  Anemia  AKI - resolved  Hypokalemia.  PLAN -  - Continue weaning efforts with high PS. - Replace K  - Minimize sedation at this point since ETT has been changed to trach. - Adjust vent to ABG. - Prednisone for COPD exacerbation, may begin tot titrate down. - Maintain current trach size and status.  Discussed with RT bedside.  Alyson Reedy, M.D. [facility] Pulmonary/Critical Care Medicine. Pager: [phone]. After hours pager: [phone].

## 2016-01-27 LAB — CULTURE, RESPIRATORY W GRAM STAIN

## 2016-01-27 LAB — CULTURE, RESPIRATORY

## 2016-01-27 LAB — POTASSIUM: Potassium: 3.4 mmol/L — ABNORMAL LOW (ref 3.5–5.1)

## 2016-01-27 LAB — MAGNESIUM: Magnesium: 1.5 mg/dL — ABNORMAL LOW (ref 1.7–2.4)

## 2016-01-28 ENCOUNTER — Other Ambulatory Visit (HOSPITAL_COMMUNITY): Payer: Self-pay

## 2016-01-28 LAB — URINALYSIS, ROUTINE W REFLEX MICROSCOPIC
BILIRUBIN URINE: NEGATIVE
Glucose, UA: NEGATIVE mg/dL
HGB URINE DIPSTICK: NEGATIVE
Ketones, ur: NEGATIVE mg/dL
Nitrite: NEGATIVE
PH: 6.5 (ref 5.0–8.0)
Protein, ur: NEGATIVE mg/dL
SPECIFIC GRAVITY, URINE: 1.018 (ref 1.005–1.030)

## 2016-01-28 LAB — CULTURE, BLOOD (ROUTINE X 2)
CULTURE: NO GROWTH
CULTURE: NO GROWTH

## 2016-01-28 LAB — URINE MICROSCOPIC-ADD ON: RBC / HPF: NONE SEEN RBC/hpf (ref 0–5)

## 2016-01-28 LAB — CBC
HEMATOCRIT: 28.5 % — AB (ref 36.0–46.0)
HEMOGLOBIN: 8.4 g/dL — AB (ref 12.0–15.0)
MCH: 27.3 pg (ref 26.0–34.0)
MCHC: 29.5 g/dL — AB (ref 30.0–36.0)
MCV: 92.5 fL (ref 78.0–100.0)
Platelets: 493 10*3/uL — ABNORMAL HIGH (ref 150–400)
RBC: 3.08 MIL/uL — ABNORMAL LOW (ref 3.87–5.11)
RDW: 18.9 % — ABNORMAL HIGH (ref 11.5–15.5)
WBC: 12.1 10*3/uL — ABNORMAL HIGH (ref 4.0–10.5)

## 2016-01-28 MED ORDER — MIDAZOLAM HCL 2 MG/2ML IJ SOLN
INTRAMUSCULAR | Status: AC
  Filled 2016-01-28: qty 2

## 2016-01-28 MED ORDER — MIDAZOLAM HCL 2 MG/2ML IJ SOLN
INTRAMUSCULAR | Status: AC | PRN
  Administered 2016-01-28: 1 mg via INTRAVENOUS
  Administered 2016-01-28: 0.5 mg via INTRAVENOUS
  Administered 2016-01-28: 1 mg via INTRAVENOUS

## 2016-01-28 MED ORDER — SODIUM CHLORIDE 0.9 % IV SOLN
INTRAVENOUS | Status: AC | PRN
  Administered 2016-01-28: 10 mL/h via INTRAVENOUS

## 2016-01-28 MED ORDER — IOHEXOL 300 MG/ML  SOLN
50.0000 mL | Freq: Once | INTRAMUSCULAR | Status: AC | PRN
  Administered 2016-01-28: 10 mL via INTRAVENOUS

## 2016-01-28 MED ORDER — FENTANYL CITRATE (PF) 100 MCG/2ML IJ SOLN
INTRAMUSCULAR | Status: AC | PRN
  Administered 2016-01-28: 50 ug via INTRAVENOUS
  Administered 2016-01-28: 25 ug via INTRAVENOUS

## 2016-01-28 MED ORDER — GLUCAGON HCL RDNA (DIAGNOSTIC) 1 MG IJ SOLR
INTRAMUSCULAR | Status: AC
  Filled 2016-01-28: qty 1

## 2016-01-28 MED ORDER — CEFAZOLIN SODIUM-DEXTROSE 2-3 GM-% IV SOLR
2.0000 g | Freq: Once | INTRAVENOUS | Status: AC
  Administered 2016-01-28: 2 g via INTRAVENOUS

## 2016-01-28 MED ORDER — FENTANYL CITRATE (PF) 100 MCG/2ML IJ SOLN
INTRAMUSCULAR | Status: AC
  Filled 2016-01-28: qty 2

## 2016-01-28 MED ORDER — LIDOCAINE HCL 1 % IJ SOLN
INTRAMUSCULAR | Status: AC
  Filled 2016-01-28: qty 20

## 2016-01-28 NOTE — Procedures (Signed)
Interventional Radiology Procedure Note  Procedure:  Gastrostomy tube placement  Complications:  None  Estimated Blood Loss:  < 10 mL  20 Fr bumper retention gastrostomy tube placed with tip in body of stomach.  OK to use in 24 hours.  Sharlene Mccluskey T. Hazley Dezeeuw, M.D Pager:  [phone]    

## 2016-01-29 LAB — URINE CULTURE: Culture: >=100000

## 2016-01-29 NOTE — Progress Notes (Signed)
   Name: Robyn Gross MRN: [id number] DOB: 12/26/1960    ADMISSION DATE:  01/16/2016 CONSULTATION DATE:  2/20  REFERRING MD :  Hijazi  CHIEF COMPLAINT:  Vent management, acute respiratory failure   BRIEF PATIENT DESCRIPTION: 55yo female with hx COPD, RA on methotrexate, chronic pain, polysubstance abuse, Hep C initially admitted 2/5 to outside hospital with acute respiratory failure r/t CAP, AECOPD.  She required intubation 2/6 and mechanical ventilatory support.   Course was c/b AKI, sepsis, ARDS.  She was treated with IV abx and ultimately tx to [facility] 2/17 for further vent wean and ongoing rehabilitation.  PCCM consulted 2/20 for vent management and assist with weaning efforts.  She remains on full vent support.    SIGNIFICANT EVENTS   STUDIES:  CT 2/23>>>  SUBJECTIVE: No events overnight, high RR with PS weaning.  VITAL SIGNS: 99.3, 112 24, 162/80  98% Currently on TC.  PHYSICAL EXAMINATION: General:  Chronically ill appearing female, NAD on vent  Neuro: Awakens, not follows commands, rass 0 HEENT: [place]/AT, PERRL, EOM-I and MMM.  Trach site clean. Cardiovascular:  RRR, Nl S1/S2, -M/R/G. Lungs:  Coarse BS diffusely. Abdomen:  Round, soft, NT, ND and +bs. Musculoskeletal:  Warm and dry, no sig edema, foot drop    Recent Labs Lab 01/23/16 0634 01/26/16 0530 01/27/16 0740  NA 141 146*  --   K 3.1* 2.8* 3.4*  CL 108 105  --   CO2 23 27  --   BUN 16 17  --   CREATININE 0.65 0.57  --   GLUCOSE 115* 118*  --     Recent Labs Lab 01/24/16 0630 01/26/16 0530 01/28/16 0600  HGB 7.9* 8.2* 8.4*  HCT 26.8* 28.0* 28.5*  WBC 14.3* 14.7* 12.1*  PLT 393 487* 493*   I reviewed CXR myself, trach in good position.  ASSESSMENT / PLAN:  Acute respiratory failure -- multifactorial r/t severe CAP with ARDS +/- AECOPD.  Continues to fail SBT.  Failed over weekend r/t apnea.  This am (2/20) failed r/t tachypnea with RR 40's.  Initially intubated 2/6.   AECOPD  Severe CAP -  abx complete  Anemia  AKI - resolved  Hypokalemia.  PLAN -  - TC for 2 hours goal today. - Continue weaning per protocol. - Replace electrolytes.  - Minimize sedation at this point since ETT has been changed to trach. - Adjust vent to ABG. - Prednisone for COPD exacerbation, titrate to off. - Maintain current trach size and status.  Discussed with RT bedside.  Alyson Reedy, M.D. [facility] Pulmonary/Critical Care Medicine. Pager: [phone]. After hours pager: [phone].

## 2016-01-30 LAB — C DIFFICILE QUICK SCREEN W PCR REFLEX
C DIFFICILE (CDIFF) INTERP: NEGATIVE
C Diff antigen: NEGATIVE
C Diff toxin: NEGATIVE

## 2016-01-31 LAB — BASIC METABOLIC PANEL
ANION GAP: 11 (ref 5–15)
BUN: 9 mg/dL (ref 6–20)
CALCIUM: 9.4 mg/dL (ref 8.9–10.3)
CO2: 26 mmol/L (ref 22–32)
CREATININE: 0.44 mg/dL (ref 0.44–1.00)
Chloride: 102 mmol/L (ref 101–111)
Glucose, Bld: 117 mg/dL — ABNORMAL HIGH (ref 65–99)
Potassium: 3.1 mmol/L — ABNORMAL LOW (ref 3.5–5.1)
SODIUM: 139 mmol/L (ref 135–145)

## 2016-01-31 LAB — CBC
HEMATOCRIT: 30.4 % — AB (ref 36.0–46.0)
Hemoglobin: 9.4 g/dL — ABNORMAL LOW (ref 12.0–15.0)
MCH: 28.4 pg (ref 26.0–34.0)
MCHC: 30.9 g/dL (ref 30.0–36.0)
MCV: 91.8 fL (ref 78.0–100.0)
Platelets: 543 10*3/uL — ABNORMAL HIGH (ref 150–400)
RBC: 3.31 MIL/uL — ABNORMAL LOW (ref 3.87–5.11)
RDW: 18.4 % — AB (ref 11.5–15.5)
WBC: 9.3 10*3/uL (ref 4.0–10.5)

## 2016-02-02 NOTE — Progress Notes (Signed)
   Name: Robyn Gross MRN: [id number] DOB: 09/27/1961    ADMISSION DATE:  01/16/2016 CONSULTATION DATE:  2/20  REFERRING MD :  Hijazi  CHIEF COMPLAINT:  Vent management, acute respiratory failure   BRIEF PATIENT DESCRIPTION:  55yo female with hx COPD, RA on methotrexate, chronic pain, polysubstance abuse, Hep C initially admitted 2/5 to outside hospital with acute respiratory failure r/t CAP, AECOPD.  She required intubation 2/6 and mechanical ventilatory support.   Course was c/b AKI, sepsis, ARDS.  She was treated with IV abx and ultimately tx to [facility] 2/17 for further vent wean and ongoing rehabilitation.  PCCM consulted 2/20 for vent management and assist with weaning efforts.  She remains on full vent support.    SIGNIFICANT EVENTS   STUDIES:    SUBJECTIVE: Awake and appropriate   VITAL SIGNS: 99.3, 98 27 138/84 sats 97% Currently on TC.  PHYSICAL EXAMINATION: General:  Chronically ill appearing female, NAD on ATC Neuro: Awakens, not follows commands, rass 0 HEENT: /AT, PERRL, EOM-I and MMM.  Trach site clean w/ ATC. Cardiovascular:  RRR, Nl S1/S2, -M/R/G. Lungs: clear and w/out accessory muscle use  Abdomen:  Round, soft, NT, ND and +bs. Musculoskeletal:  Warm and dry, no sig edema, foot drop    Recent Labs Lab 01/27/16 0740 01/31/16 0716  NA  --  139  K 3.4* 3.1*  CL  --  102  CO2  --  26  BUN  --  9  CREATININE  --  0.44  GLUCOSE  --  117*    Recent Labs Lab 01/28/16 0600 01/31/16 0716  HGB 8.4* 9.4*  HCT 28.5* 30.4*  WBC 12.1* 9.3  PLT 493* 543*   I reviewed CXR myself, trach in good position.  ASSESSMENT / PLAN:  Acute respiratory failure -- multifactorial r/t severe CAP with ARDS +/- AECOPD.  S/p trach 2/24 AECOPD  Anemia   Hypokalemia.  Discussion --> she is now off vent X24hrs. Tolerating ATC AND PMV well.   PLAN -  - wean FIo2 - Prednisone for COPD exacerbation, titrate to off. - we will down-size to #6 cuffless 3/7 if  stays off vent  - cont rehab efforts.   Discussed with RT bedside.  Simonne MartinetPeter E Verdell Dykman ACNP-BC [facility] Pulmonary/Critical Care Pager # [phone] OR # [phone] if no answer

## 2016-02-03 ENCOUNTER — Other Ambulatory Visit (HOSPITAL_COMMUNITY): Payer: Self-pay

## 2016-02-04 ENCOUNTER — Other Ambulatory Visit (HOSPITAL_COMMUNITY): Payer: Self-pay

## 2016-02-05 ENCOUNTER — Other Ambulatory Visit (HOSPITAL_COMMUNITY): Payer: Self-pay

## 2016-02-05 LAB — CBC WITH DIFFERENTIAL/PLATELET
Basophils Absolute: 0.1 10*3/uL (ref 0.0–0.1)
Basophils Relative: 1 %
EOS PCT: 2 %
Eosinophils Absolute: 0.1 10*3/uL (ref 0.0–0.7)
HEMATOCRIT: 35.3 % — AB (ref 36.0–46.0)
Hemoglobin: 10.9 g/dL — ABNORMAL LOW (ref 12.0–15.0)
LYMPHS ABS: 2.7 10*3/uL (ref 0.7–4.0)
LYMPHS PCT: 36 %
MCH: 28.7 pg (ref 26.0–34.0)
MCHC: 30.9 g/dL (ref 30.0–36.0)
MCV: 92.9 fL (ref 78.0–100.0)
MONO ABS: 0.7 10*3/uL (ref 0.1–1.0)
MONOS PCT: 9 %
NEUTROS ABS: 3.9 10*3/uL (ref 1.7–7.7)
Neutrophils Relative %: 52 %
PLATELETS: 484 10*3/uL — AB (ref 150–400)
RBC: 3.8 MIL/uL — ABNORMAL LOW (ref 3.87–5.11)
RDW: 17.2 % — AB (ref 11.5–15.5)
WBC: 7.4 10*3/uL (ref 4.0–10.5)

## 2016-02-05 LAB — BASIC METABOLIC PANEL
ANION GAP: 15 (ref 5–15)
BUN: 11 mg/dL (ref 6–20)
CALCIUM: 10.2 mg/dL (ref 8.9–10.3)
CO2: 28 mmol/L (ref 22–32)
Chloride: 97 mmol/L — ABNORMAL LOW (ref 101–111)
Creatinine, Ser: 0.51 mg/dL (ref 0.44–1.00)
GFR calc Af Amer: >60 mL/min (ref >60)
GLUCOSE: 112 mg/dL — AB (ref 65–99)
Potassium: 4.4 mmol/L (ref 3.5–5.1)
Sodium: 140 mmol/L (ref 135–145)

## 2016-02-05 LAB — MAGNESIUM: Magnesium: 1.8 mg/dL (ref 1.7–2.4)

## 2016-02-05 LAB — PHOSPHORUS: Phosphorus: 6 mg/dL — ABNORMAL HIGH (ref 2.5–4.6)

## 2016-02-05 NOTE — Progress Notes (Signed)
   Name: Robyn Gross MRN: [id number] DOB: 03/06/1961    ADMISSION DATE:  01/16/2016 CONSULTATION DATE:  2/20  REFERRING MD :  Hijazi  CHIEF COMPLAINT:  Vent management, acute respiratory failure   BRIEF PATIENT DESCRIPTION:  [age]yo female with hx COPD, RA on methotrexate, chronic pain, polysubstance abuse, Hep C initially admitted 2/5 to outside hospital with acute respiratory failure r/t CAP, AECOPD.  She required intubation 2/6 and mechanical ventilatory support.   Course was c/b AKI, sepsis, ARDS.  She was treated with IV abx and ultimately tx to [facility] 2/17 for further vent wean and ongoing rehabilitation.  PCCM consulted 2/20 for vent management and assist with weaning efforts.  She remains on full vent support.    SIGNIFICANT EVENTS   STUDIES:    SUBJECTIVE: Awake and appropriate   VITAL SIGNS: 98.3, 90 27 125/71 sats 97% Currently on TC.  PHYSICAL EXAMINATION: General:  Chronically ill appearing female, NAD on ATC Neuro: Awakens,  follows commands, rass 0, speech clear vis pmv HEENT: [place]/AT, PERRL, EOM-I and MMM.  Trach site clean w/ ATC. Cardiovascular:  RRR, Nl S1/S2, -M/R/G. Lungs: clear and w/out accessory muscle use  Abdomen:  Round, soft, NT, ND and +bs. Musculoskeletal:  Warm and dry, no sig edema, foot drop    Recent Labs Lab 01/31/16 0716 02/05/16 0714  NA 139 140  K 3.1* 4.4  CL 102 97*  CO2 26 28  BUN 9 11  CREATININE 0.44 0.51  GLUCOSE 117* 112*    Recent Labs Lab 01/31/16 0716 02/05/16 0714  HGB 9.4* 10.9*  HCT 30.4* 35.3*  WBC 9.3 7.4  PLT 543* 484*   No results found.   ASSESSMENT / PLAN:  Acute respiratory failure -- multifactorial r/t severe CAP with ARDS +/- AECOPD.  S/p trach 2/24 AECOPD  Anemia   Hypokalemia.  Discussion --> she is now off vent X72hrs. Tolerating ATC AND PMV well.   PLAN -  - wean FIo2 - Prednisone for COPD exacerbation, titrate to off. - we will down-size to #6 cuffless  if stays off vent  -  cont rehab efforts.     Brett CanalesSteve Minor ACNP [facility] Pager [phone] till 3 pm If no answer page [phone] 02/05/2016, 9:48 AM

## 2016-02-09 LAB — BASIC METABOLIC PANEL
Anion gap: 10 (ref 5–15)
BUN: 8 mg/dL (ref 6–20)
CHLORIDE: 99 mmol/L — AB (ref 101–111)
CO2: 30 mmol/L (ref 22–32)
Calcium: 10.1 mg/dL (ref 8.9–10.3)
Creatinine, Ser: 0.56 mg/dL (ref 0.44–1.00)
GFR calc Af Amer: >60 mL/min (ref >60)
GFR calc non Af Amer: >60 mL/min (ref >60)
GLUCOSE: 111 mg/dL — AB (ref 65–99)
POTASSIUM: 3.9 mmol/L (ref 3.5–5.1)
SODIUM: 139 mmol/L (ref 135–145)

## 2016-02-09 LAB — CBC WITH DIFFERENTIAL/PLATELET
Basophils Absolute: 0.1 10*3/uL (ref 0.0–0.1)
Basophils Relative: 1 %
Eosinophils Absolute: 0.1 10*3/uL (ref 0.0–0.7)
Eosinophils Relative: 1 %
HEMATOCRIT: 35.9 % — AB (ref 36.0–46.0)
HEMOGLOBIN: 10.9 g/dL — AB (ref 12.0–15.0)
LYMPHS ABS: 3.4 10*3/uL (ref 0.7–4.0)
LYMPHS PCT: 40 %
MCH: 27.9 pg (ref 26.0–34.0)
MCHC: 30.4 g/dL (ref 30.0–36.0)
MCV: 91.8 fL (ref 78.0–100.0)
MONOS PCT: 7 %
Monocytes Absolute: 0.6 10*3/uL (ref 0.1–1.0)
NEUTROS PCT: 51 %
Neutro Abs: 4.5 10*3/uL (ref 1.7–7.7)
Platelets: 454 10*3/uL — ABNORMAL HIGH (ref 150–400)
RBC: 3.91 MIL/uL (ref 3.87–5.11)
RDW: 16.4 % — ABNORMAL HIGH (ref 11.5–15.5)
WBC: 8.6 10*3/uL (ref 4.0–10.5)

## 2016-02-11 LAB — BASIC METABOLIC PANEL
ANION GAP: 14 (ref 5–15)
BUN: 7 mg/dL (ref 6–20)
CHLORIDE: 96 mmol/L — AB (ref 101–111)
CO2: 29 mmol/L (ref 22–32)
CREATININE: 0.59 mg/dL (ref 0.44–1.00)
Calcium: 10.2 mg/dL (ref 8.9–10.3)
GFR calc non Af Amer: >60 mL/min (ref >60)
Glucose, Bld: 112 mg/dL — ABNORMAL HIGH (ref 65–99)
POTASSIUM: 3.4 mmol/L — AB (ref 3.5–5.1)
Sodium: 139 mmol/L (ref 135–145)

## 2016-02-11 LAB — CBC
HEMATOCRIT: 35.2 % — AB (ref 36.0–46.0)
HEMOGLOBIN: 11 g/dL — AB (ref 12.0–15.0)
MCH: 28.8 pg (ref 26.0–34.0)
MCHC: 31.3 g/dL (ref 30.0–36.0)
MCV: 92.1 fL (ref 78.0–100.0)
Platelets: 377 10*3/uL (ref 150–400)
RBC: 3.82 MIL/uL — AB (ref 3.87–5.11)
RDW: 16.5 % — ABNORMAL HIGH (ref 11.5–15.5)
WBC: 6.1 10*3/uL (ref 4.0–10.5)

## 2016-02-13 LAB — BASIC METABOLIC PANEL
ANION GAP: 13 (ref 5–15)
BUN: <5 mg/dL — ABNORMAL LOW (ref 6–20)
CALCIUM: 10.3 mg/dL (ref 8.9–10.3)
CO2: 29 mmol/L (ref 22–32)
Chloride: 98 mmol/L — ABNORMAL LOW (ref 101–111)
Creatinine, Ser: 0.62 mg/dL (ref 0.44–1.00)
Glucose, Bld: 93 mg/dL (ref 65–99)
POTASSIUM: 4.4 mmol/L (ref 3.5–5.1)
Sodium: 140 mmol/L (ref 135–145)

## 2016-02-16 LAB — CBC WITH DIFFERENTIAL/PLATELET
BASOS ABS: 0 10*3/uL (ref 0.0–0.1)
BASOS PCT: 0 %
EOS ABS: 0.2 10*3/uL (ref 0.0–0.7)
Eosinophils Relative: 1 %
HCT: 36.7 % (ref 36.0–46.0)
HEMOGLOBIN: 11.4 g/dL — AB (ref 12.0–15.0)
Lymphocytes Relative: 47 %
Lymphs Abs: 4.8 10*3/uL — ABNORMAL HIGH (ref 0.7–4.0)
MCH: 28.9 pg (ref 26.0–34.0)
MCHC: 31.1 g/dL (ref 30.0–36.0)
MCV: 93.1 fL (ref 78.0–100.0)
Monocytes Absolute: 0.6 10*3/uL (ref 0.1–1.0)
Monocytes Relative: 6 %
NEUTROS PCT: 46 %
Neutro Abs: 4.8 10*3/uL (ref 1.7–7.7)
Platelets: 462 10*3/uL — ABNORMAL HIGH (ref 150–400)
RBC: 3.94 MIL/uL (ref 3.87–5.11)
RDW: 15.6 % — ABNORMAL HIGH (ref 11.5–15.5)
WBC: 10.4 10*3/uL (ref 4.0–10.5)

## 2016-02-16 LAB — BASIC METABOLIC PANEL
Anion gap: 12 (ref 5–15)
BUN: <5 mg/dL — ABNORMAL LOW (ref 6–20)
CHLORIDE: 101 mmol/L (ref 101–111)
CO2: 28 mmol/L (ref 22–32)
CREATININE: 0.57 mg/dL (ref 0.44–1.00)
Calcium: 9.9 mg/dL (ref 8.9–10.3)
GFR calc non Af Amer: >60 mL/min (ref >60)
Glucose, Bld: 120 mg/dL — ABNORMAL HIGH (ref 65–99)
POTASSIUM: 3.4 mmol/L — AB (ref 3.5–5.1)
SODIUM: 141 mmol/L (ref 135–145)

## 2016-02-17 ENCOUNTER — Other Ambulatory Visit (HOSPITAL_COMMUNITY): Payer: Self-pay

## 2016-02-17 LAB — POTASSIUM: Potassium: 4.1 mmol/L (ref 3.5–5.1)

## 2016-02-21 ENCOUNTER — Other Ambulatory Visit (HOSPITAL_COMMUNITY): Payer: Self-pay

## 2016-02-23 LAB — CBC
HCT: 33.5 % — ABNORMAL LOW (ref 36.0–46.0)
Hemoglobin: 9.8 g/dL — ABNORMAL LOW (ref 12.0–15.0)
MCH: 26.8 pg (ref 26.0–34.0)
MCHC: 29.3 g/dL — AB (ref 30.0–36.0)
MCV: 91.8 fL (ref 78.0–100.0)
Platelets: 438 10*3/uL — ABNORMAL HIGH (ref 150–400)
RBC: 3.65 MIL/uL — ABNORMAL LOW (ref 3.87–5.11)
RDW: 15.4 % (ref 11.5–15.5)
WBC: 7.5 10*3/uL (ref 4.0–10.5)

## 2016-02-23 LAB — BASIC METABOLIC PANEL
Anion gap: 10 (ref 5–15)
CALCIUM: 9.2 mg/dL (ref 8.9–10.3)
CHLORIDE: 103 mmol/L (ref 101–111)
CO2: 29 mmol/L (ref 22–32)
CREATININE: 0.62 mg/dL (ref 0.44–1.00)
GFR calc non Af Amer: >60 mL/min (ref >60)
Glucose, Bld: 88 mg/dL (ref 65–99)
Potassium: 4 mmol/L (ref 3.5–5.1)
SODIUM: 142 mmol/L (ref 135–145)

## 2016-10-14 IMAGING — XA IR PERC PLACEMENT GASTROSTOMY
1 series · 1 of 1 positions shown · non-contrast
Comparison: none

CLINICAL DATA: Respiratory failure, dysphagia and need for
percutaneous gastrostomy tube for nutritional needs.

[Series 1: fl (-) angio · 1 of 1 slices shown]
[im 1/1]
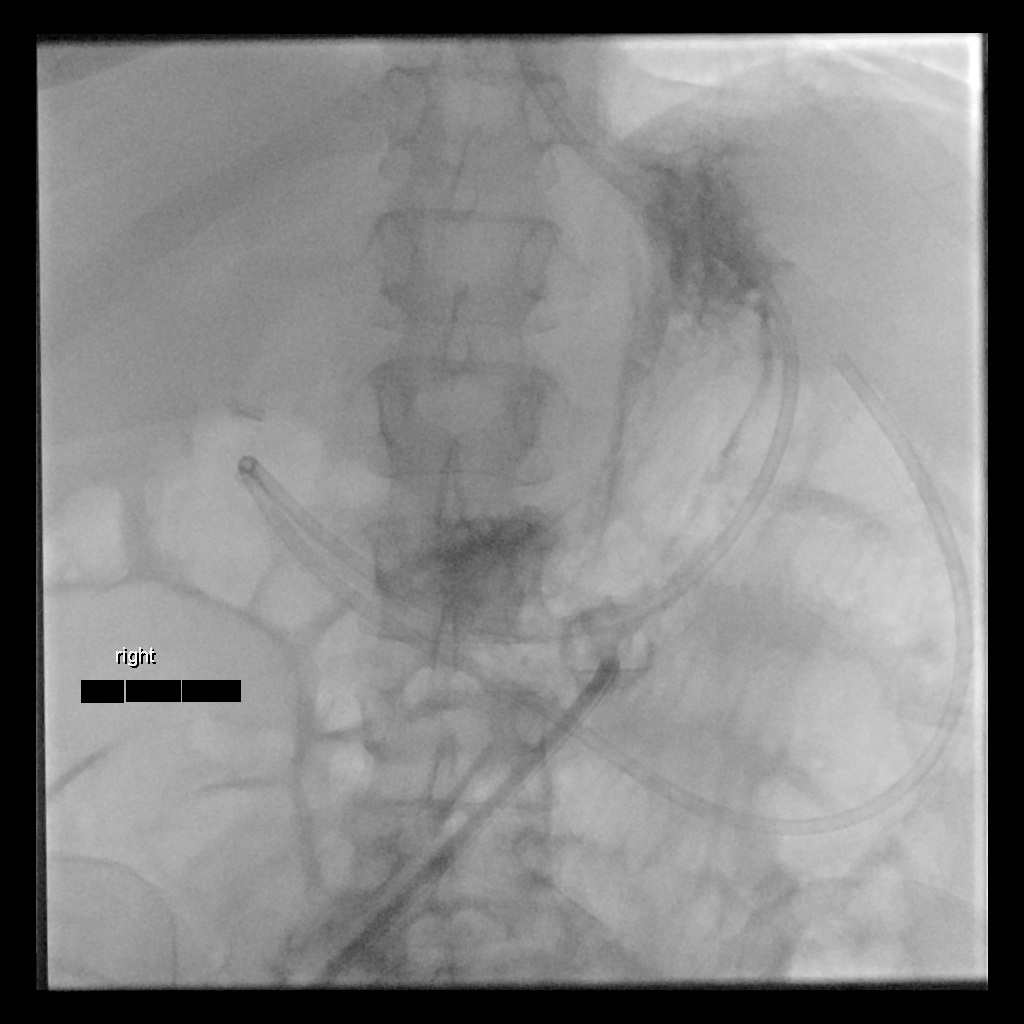

[1 of 1 positions shown; findings below may reference images not displayed]

EXAM:
PERCUTANEOUS GASTROSTOMY TUBE PLACEMENT

ANESTHESIA/SEDATION:
2.0 mg IV Versed; 75 mcg IV Fentanyl.

Total Moderate Sedation Time

23 minutes.

The patient's level of consciousness and physiologic status were
continuously monitored during the procedure by Radiology nursing.

CONTRAST:  10mL OMNIPAQUE IOHEXOL 300 MG/ML  SOLN

MEDICATIONS:
2 g IV Ancef. As antibiotic prophylaxis, Ancef was ordered
pre-procedure and administered intravenously within one hour of
incision.

FLUOROSCOPY TIME:  1 minutes and 30 seconds.

PROCEDURE:
The procedure, risks, benefits, and alternatives were explained to
the patient's mother. Questions regarding the procedure were
encouraged and answered. The patient's mother understands and
consents to the procedure.

A time-out was performed prior to initiating the procedure. A
5-French catheter was then advanced through the the patient's mouth
under fluoroscopy into the esophagus and to the level of the
stomach. This catheter was used to insufflate the stomach with air
under fluoroscopy.

The abdominal wall was prepped with Betadine in a sterile fashion,
and a sterile drape was applied covering the operative field. A
sterile gown and sterile gloves were used for the procedure. Local
anesthesia was provided with 1% Lidocaine.

A skin incision was made in the upper abdominal wall. Under
fluoroscopy, an 18 gauge trocar needle was advanced into the
stomach. Contrast injection was performed to confirm intraluminal
position of the needle tip. A single T tack was then deployed in the
lumen of the stomach. This was brought up to tension at the skin
surface.

Over a guidewire, a 9-French sheath was advanced into the lumen of
the stomach. The wire was left in place as a safety wire. A loop
snare device from a percutaneous gastrostomy kit was then advanced
into the stomach.

A floppy guide wire was advanced through the orogastric catheter
under fluoroscopy in the stomach. The loop snare advanced through
the percutaneous gastric access was used to snare the guide wire.
This allowed withdrawal of the loop snare out of the patient's mouth
by retraction of the orogastric catheter and wire.

A 20-French bumper retention gastrostomy tube was looped around the
snare device. It was then pulled back through the patient's mouth.
The retention bumper was brought up to the anterior gastric wall.
The T tack suture was cut at the skin. The exiting gastrostomy tube
was cut to appropriate length and a feeding adapter applied. The
catheter was injected with contrast material to confirm position and
a fluoroscopic spot image saved. The tube was then flushed with
saline. A dressing was applied over the gastrostomy exit site.

COMPLICATIONS:
None.
FINDINGS: The stomach distended well with air allowing safe placement of the
gastrostomy tube. After placement, the tip of the gastrostomy tube
lies in the body of the stomach.
IMPRESSION: Percutaneous gastrostomy with placement of a 20-French bumper
retention tube in the body of the stomach. This tube can be used for
percutaneous feeds beginning in 24 hours after placement.

## 2016-11-07 IMAGING — CR DG ABD PORTABLE 1V
1 series · 1 of 1 positions shown · non-contrast
Comparison: 02/05/2016

CLINICAL DATA: 55-year-old female with a history of abdominal pain

EXAM:
PORTABLE ABDOMEN - 1 VIEW

[AP]
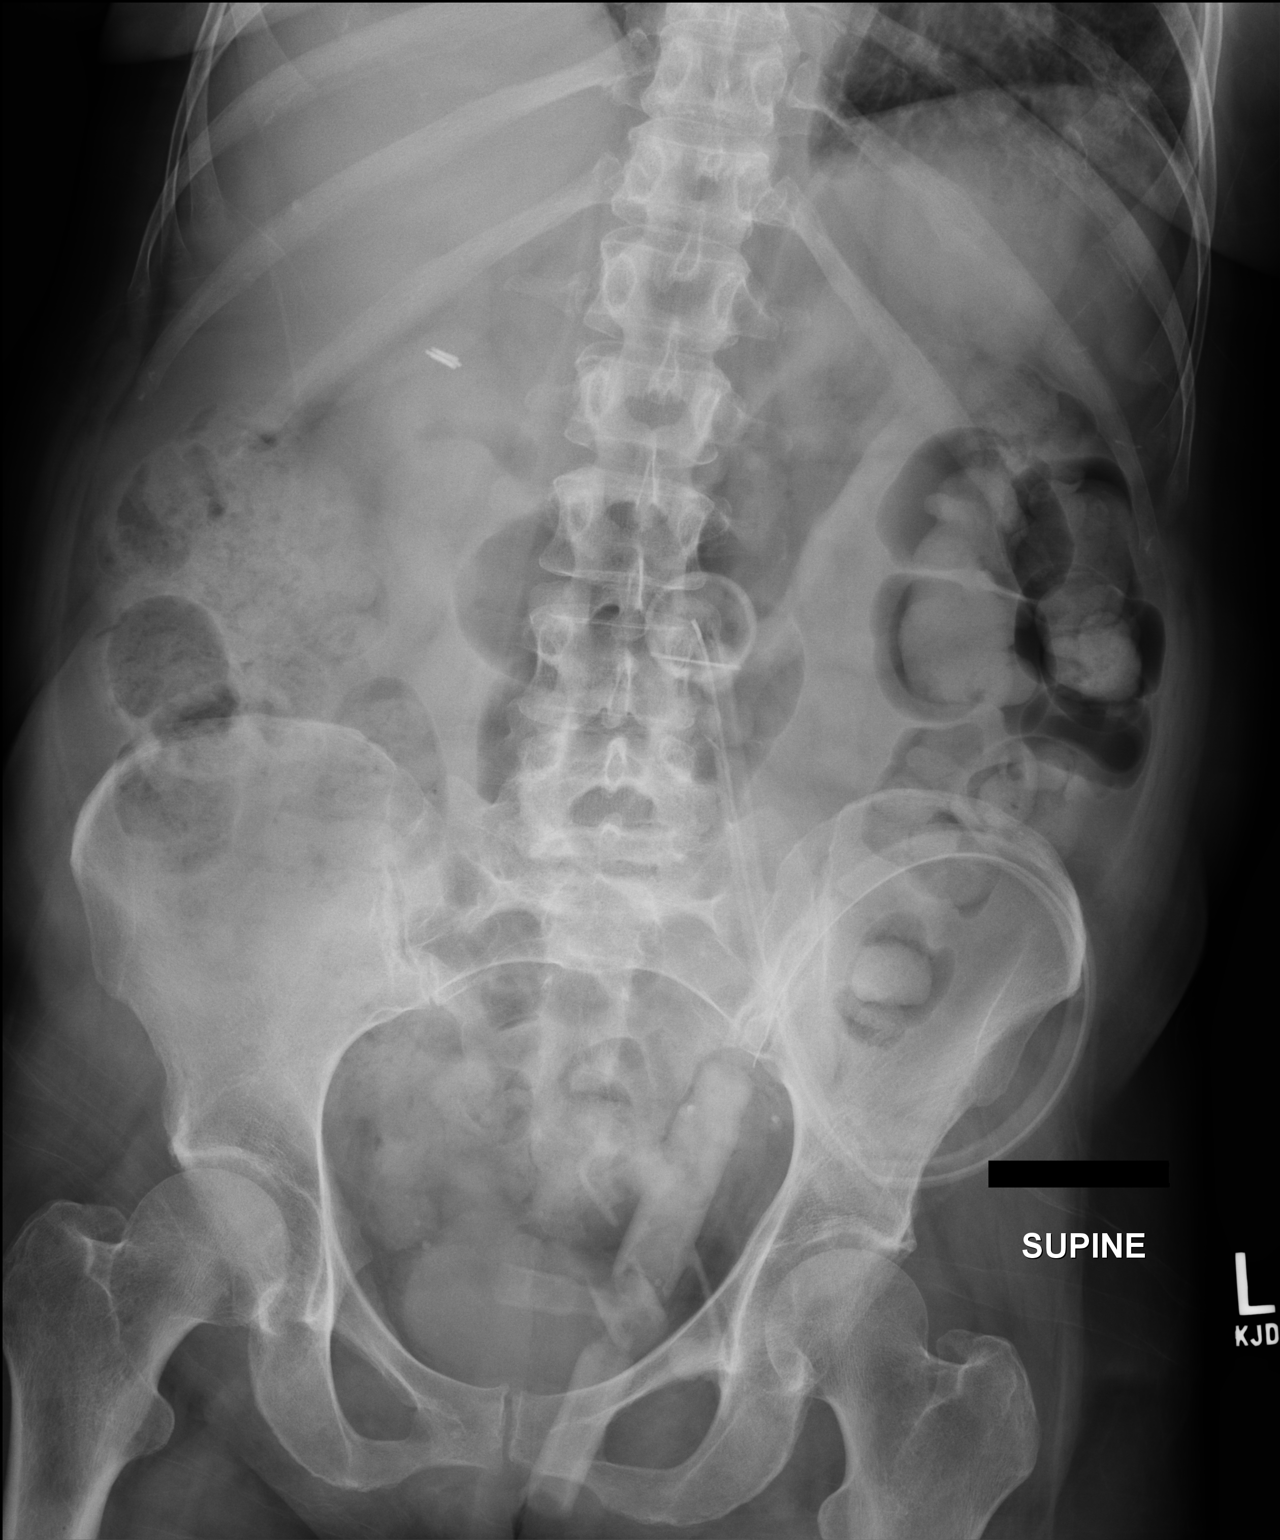

[1 of 1 positions shown; findings below may reference images not displayed]

FINDINGS: Percutaneous gastrostomy tube projects within the midline abdomen,
overlying the stomach gas.

Gas within small bowel and colon.

Small amount of formed stool within the hepatic flexure and
descending colon.

No abnormally distended small bowel or colon.

Surgical changes of cholecystectomy.

No displaced fracture.
IMPRESSION: Unremarkable bowel gas pattern.

Mild stool burden.

Gastrostomy tube is unchanged.

## 2022-11-29 DEATH — deceased
